# Patient Record
Sex: Female | Born: 1990 | Race: White | Hispanic: No | Marital: Single
Health system: Southern US, Community
[De-identification: ages and names within clinical notes are randomized; demographics above are authoritative.]

## PROBLEM LIST (undated history)

## (undated) DIAGNOSIS — E611 Iron deficiency: Secondary | ICD-10-CM

## (undated) DIAGNOSIS — Z8669 Personal history of other diseases of the nervous system and sense organs: Secondary | ICD-10-CM

## (undated) DIAGNOSIS — O2690 Pregnancy related conditions, unspecified, unspecified trimester: Secondary | ICD-10-CM

## (undated) HISTORY — DX: Personal history of other diseases of the nervous system and sense organs: Z86.69

## (undated) HISTORY — DX: Iron deficiency: E61.1

## (undated) HISTORY — DX: Pregnancy related conditions, unspecified, unspecified trimester: O26.90

---
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
## 2008-03-14 ENCOUNTER — Emergency Department (HOSPITAL_COMMUNITY): Admission: EM | Admit: 2008-03-14 | Discharge: 2008-03-14 | Payer: Self-pay | Admitting: Family Medicine

## 2008-03-17 ENCOUNTER — Ambulatory Visit: Payer: Self-pay | Admitting: Vascular Surgery

## 2008-03-17 ENCOUNTER — Encounter (INDEPENDENT_AMBULATORY_CARE_PROVIDER_SITE_OTHER): Payer: Self-pay | Admitting: Emergency Medicine

## 2008-03-17 ENCOUNTER — Emergency Department (HOSPITAL_COMMUNITY): Admission: EM | Admit: 2008-03-17 | Discharge: 2008-03-17 | Payer: Self-pay | Admitting: Family Medicine

## 2008-12-06 IMAGING — CT CT ANGIO CHEST
1 of 4 series · 15 of 31 positions shown · IV contrast (agent unspecified)
Comparison: Plain film of earlier today.

CLINICAL DATA: RIGHT-SIDED CHEST PAIN.  WEAKNESS.  DIZZINESS.

CT Angiography of the Chest.
TECHNIQUE: Multidetector CT angiography of the chest was performed
after contrast with bolus timed to evaluate the pulmonary arteries.
Contrast:  80 ml [id number]

[Series 2: pe · axial · 0.59mm/px · z∈[-264,-15]mm · 15 of 231 slices shown]
[im 16/231  lung]
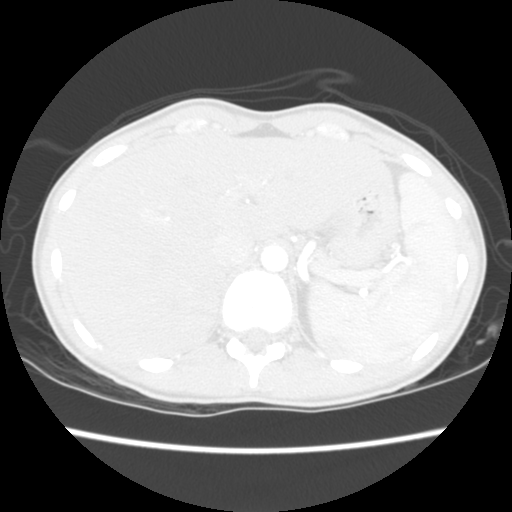
[im 31/231  mediastinal]
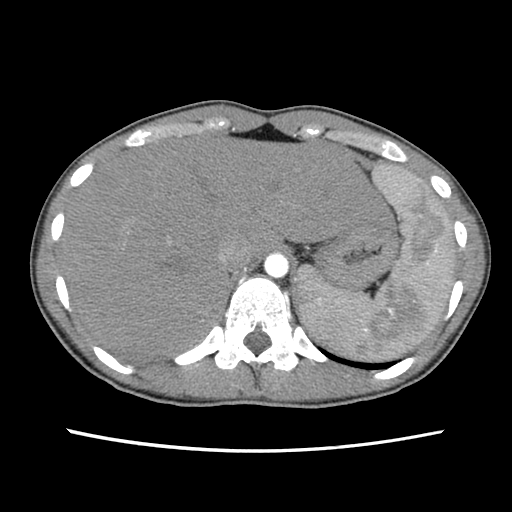
[im 47/231  lung]
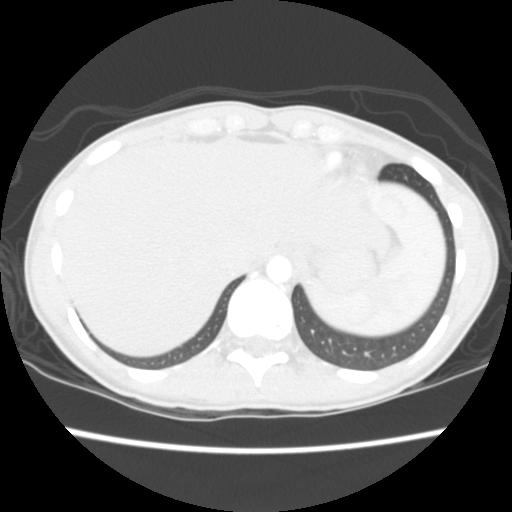
[im 62/231  mediastinal]
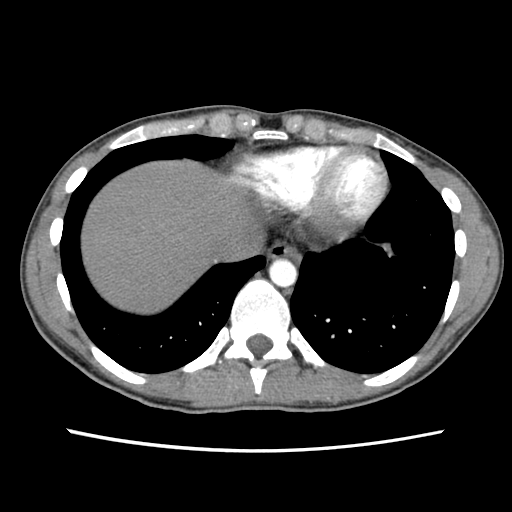
[im 77/231  lung]
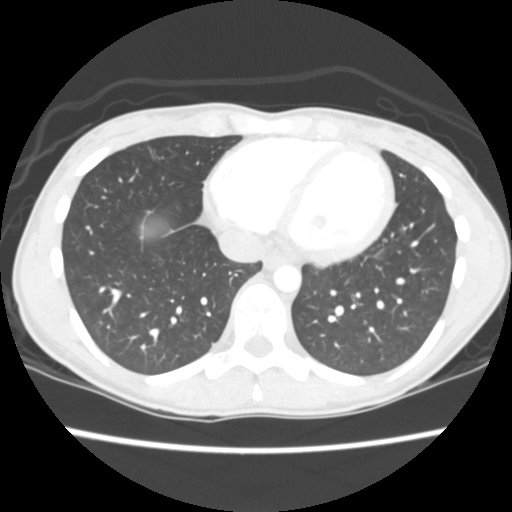
[im 93/231  mediastinal]
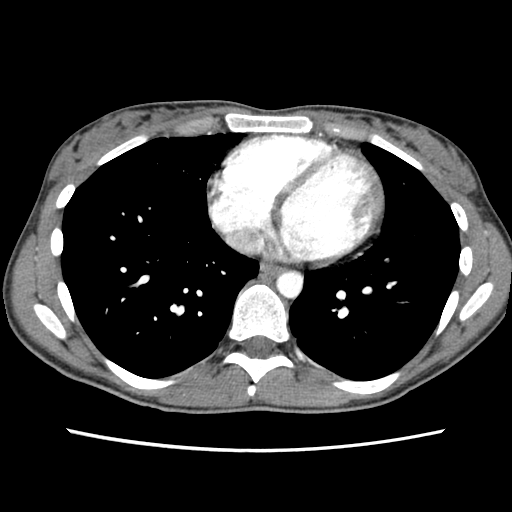
[im 108/231  lung]
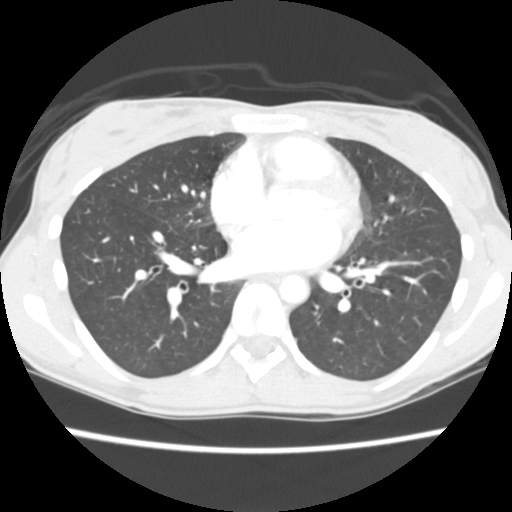
[im 116/231  mediastinal]
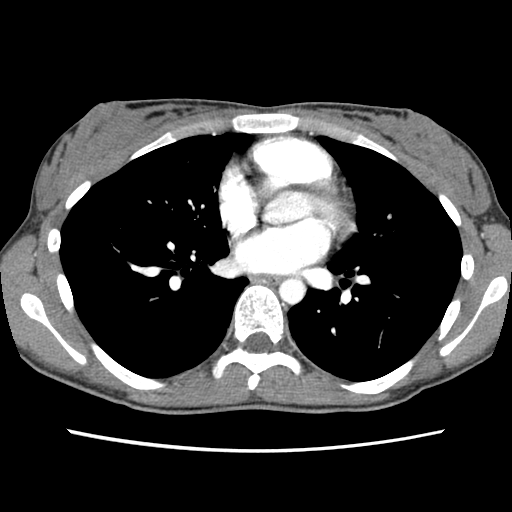
[im 123/231  lung]
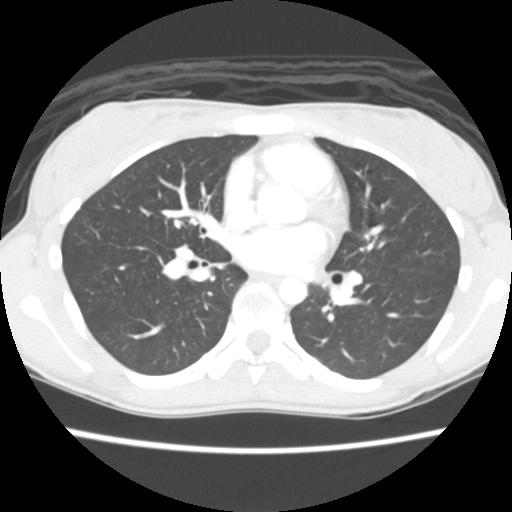
[im 139/231  mediastinal]
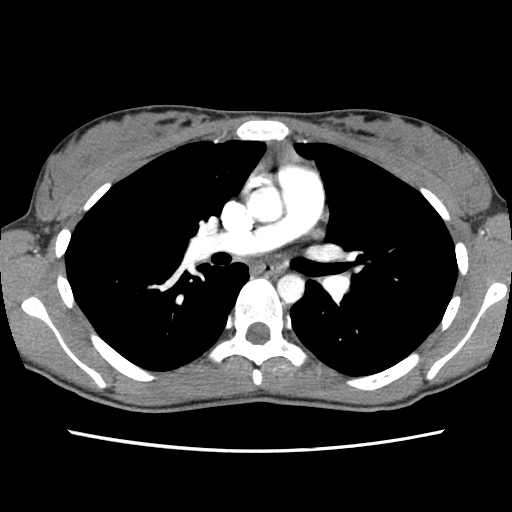
[im 154/231  lung]
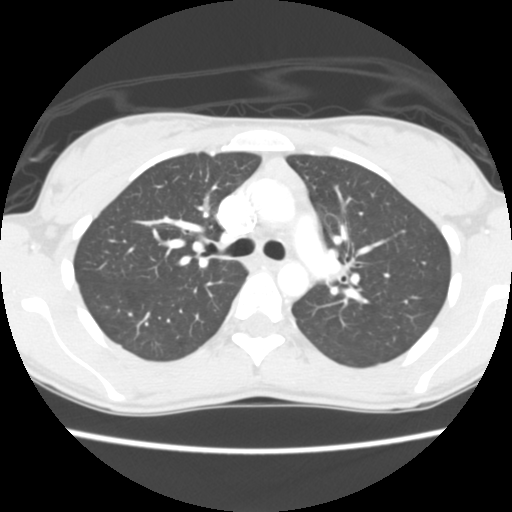
[im 169/231  mediastinal]
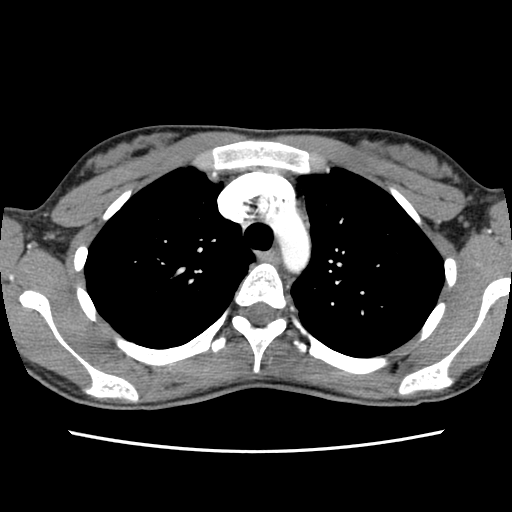
[im 185/231  lung]
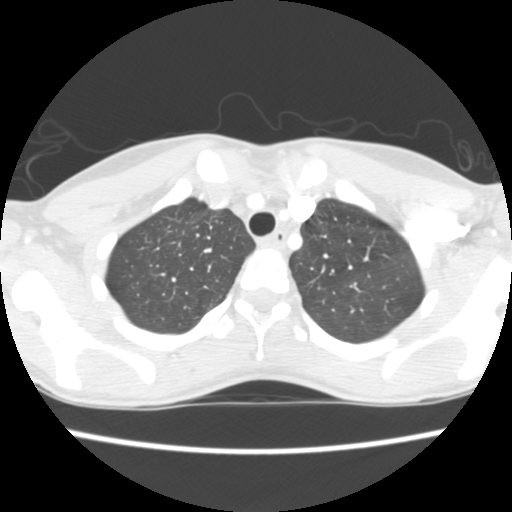
[im 200/231  mediastinal]
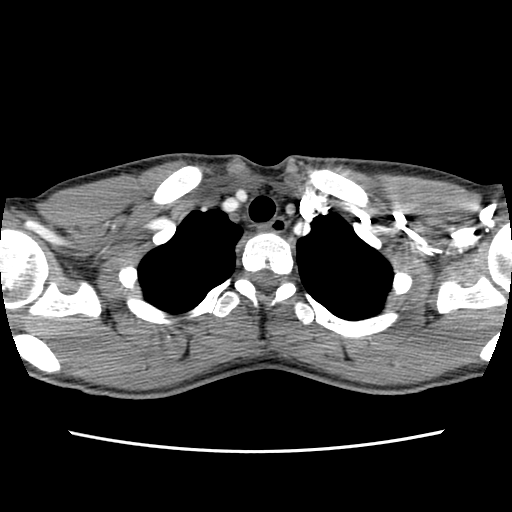
[im 215/231  lung]
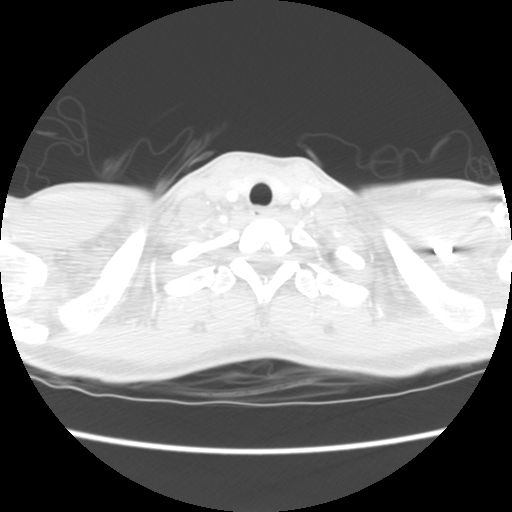

[15 of 31 positions shown; findings below may reference images not displayed]

FINDINGS: Lung windows demonstrate mild motion artifact involving
the lung bases.  Minimal increased density in the posterior right
apex on image 28 of series 4 which likely relates to atelectasis.

Soft tissue windows:  The quality of this exam for evaluation of
pulmonary embolism is good to excellent. no evidence of pulmonary
embolism.

 Normal aortic caliber without dissection. Normal heart size
without pericardial or pleural effusion.  No mediastinal or hilar
adenopathy. Anterior mediastinal residual thymus No acute osseous
abnormality. Limited abdominal imaging is unremarkable.
IMPRESSION: 1.  No evidence of pulmonary embolism.
2.  No acute process in the chest.

## 2009-05-16 ENCOUNTER — Emergency Department (HOSPITAL_COMMUNITY): Admission: EM | Admit: 2009-05-16 | Discharge: 2009-05-16 | Payer: Self-pay | Admitting: Family Medicine

## 2011-01-05 LAB — POCT URINALYSIS DIP (DEVICE)
Bilirubin Urine: NEGATIVE
Glucose, UA: NEGATIVE mg/dL
Ketones, ur: NEGATIVE mg/dL
Protein, ur: NEGATIVE mg/dL
pH: 7.5 (ref 5.0–8.0)

## 2011-06-27 LAB — POCT URINALYSIS DIP (DEVICE)
Bilirubin Urine: NEGATIVE
Glucose, UA: NEGATIVE
Hgb urine dipstick: NEGATIVE
Ketones, ur: NEGATIVE
Nitrite: NEGATIVE
Protein, ur: NEGATIVE
Specific Gravity, Urine: 1.02
Urobilinogen, UA: 1

## 2011-06-27 LAB — POCT I-STAT, CHEM 8
Calcium, Ion: 1.19
Chloride: 103
Creatinine, Ser: 0.7
Glucose, Bld: 81
HCT: 40
Hemoglobin: 13.6

## 2011-06-27 LAB — HEPATIC FUNCTION PANEL
ALT: 11
AST: 15
Albumin: 3.7
Alkaline Phosphatase: 54
Bilirubin, Direct: 0.2
Indirect Bilirubin: 0.7
Total Bilirubin: 0.9
Total Protein: 6.8

## 2011-06-27 LAB — LIPASE, BLOOD: Lipase: 18

## 2011-06-27 LAB — DIFFERENTIAL
Eosinophils Absolute: 0.2
Eosinophils Relative: 2
Monocytes Relative: 9
Neutro Abs: 5.4

## 2011-06-27 LAB — POCT PREGNANCY, URINE
Operator id: 272551
Preg Test, Ur: NEGATIVE

## 2011-06-27 LAB — CBC
MCV: 89.1
WBC: 8

## 2020-07-29 ENCOUNTER — Encounter (HOSPITAL_COMMUNITY): Payer: Self-pay | Admitting: Emergency Medicine

## 2020-07-29 ENCOUNTER — Emergency Department (HOSPITAL_COMMUNITY)
Admission: EM | Admit: 2020-07-29 | Discharge: 2020-07-29 | Disposition: A | Discharge status: Home / Self Care | Attending: Emergency Medicine | Admitting: Emergency Medicine

## 2020-07-29 ENCOUNTER — Emergency Department (HOSPITAL_COMMUNITY)

## 2020-07-29 ENCOUNTER — Other Ambulatory Visit: Payer: Self-pay

## 2020-07-29 DIAGNOSIS — T7840XA Allergy, unspecified, initial encounter: Secondary | ICD-10-CM | POA: Diagnosis not present

## 2020-07-29 DIAGNOSIS — R21 Rash and other nonspecific skin eruption: Secondary | ICD-10-CM | POA: Diagnosis present

## 2020-07-29 LAB — I-STAT BETA HCG BLOOD, ED (MC, WL, AP ONLY): I-stat hCG, quantitative: <5 m[IU]/mL (ref <5)

## 2020-07-29 MED ORDER — PREDNISONE 20 MG PO TABS: ORAL_TABLET | ORAL | 0 refills | Status: DC

## 2020-07-29 MED ORDER — SODIUM CHLORIDE 0.9 % IV BOLUS
500.0000 mL | Freq: Once | INTRAVENOUS | Status: AC
  Administered 2020-07-29: 500 mL via INTRAVENOUS

## 2020-07-29 MED ORDER — DIPHENHYDRAMINE HCL 50 MG/ML IJ SOLN: 50.0000 mg | Freq: Once | INTRAMUSCULAR | Status: AC

## 2020-07-29 MED ORDER — FAMOTIDINE 20 MG PO TABS: 20.0000 mg | ORAL_TABLET | Freq: Two times a day (BID) | ORAL | 0 refills | Status: DC

## 2020-07-29 MED ORDER — METHYLPREDNISOLONE SODIUM SUCC 125 MG IJ SOLR
INTRAMUSCULAR | Status: AC
  Administered 2020-07-29: 125 mg via INTRAVENOUS
  Filled 2020-07-29: qty 2

## 2020-07-29 MED ORDER — FAMOTIDINE IN NACL 20-0.9 MG/50ML-% IV SOLN
20.0000 mg | Freq: Once | INTRAVENOUS | Status: AC
  Administered 2020-07-29: 20 mg via INTRAVENOUS
  Filled 2020-07-29: qty 50

## 2020-07-29 MED ORDER — METHYLPREDNISOLONE SODIUM SUCC 125 MG IJ SOLR: 125.0000 mg | Freq: Once | INTRAMUSCULAR | Status: AC

## 2020-07-29 MED ORDER — DIPHENHYDRAMINE HCL 50 MG/ML IJ SOLN
INTRAMUSCULAR | Status: AC
  Administered 2020-07-29: 50 mg via INTRAVENOUS
  Filled 2020-07-29: qty 1

## 2020-07-29 NOTE — ED Triage Notes (Addendum)
Pt from home; says began having an allergic reaction at 1200; says took several doses of Benadryl with improvement, but worsening again after Benadryl wore off.  Pt with global splotchy red, itchy rash.

## 2020-07-29 NOTE — ED Provider Notes (Signed)
[facility] EMERGENCY DEPARTMENT Provider Note   CSN: [id number] Arrival date & time: 07/29/20  0403     History No chief complaint on file.   Katrina Kim is a 29 y.o. female.  The history is provided by the patient.  Allergic Reaction Presenting symptoms: itching and rash   Presenting symptoms: no swelling   Severity:  Moderate Duration:  1 day Prior allergic episodes:  No prior episodes Context: food and medications   Relieved by:  Nothing Worsened by:  Nothing Ineffective treatments:  None tried      History reviewed. No pertinent past medical history.  There are no problems to display for this patient.   History reviewed. No pertinent surgical history.   OB History   No obstetric history on file.     History reviewed. No pertinent family history.  Social History   Tobacco Use  . Smoking status: Not on file  Substance Use Topics  . Alcohol use: Not on file  . Drug use: Not on file    Home Medications Prior to Admission medications   Not on File    Allergies    Patient has no allergy information on record.  Review of Systems   Review of Systems  Constitutional: Negative for fever.  HENT: Negative for congestion.   Eyes: Negative for visual disturbance.  Respiratory: Negative for shortness of breath.   Cardiovascular: Negative for chest pain.  Gastrointestinal: Negative for abdominal pain.  Genitourinary: Negative for difficulty urinating.  Musculoskeletal: Negative for arthralgias.  Skin: Positive for itching and rash.  Neurological: Negative for dizziness.  Psychiatric/Behavioral: The patient is nervous/anxious.   All other systems reviewed and are negative.   Physical Exam Updated Vital Signs BP 109/71   Pulse (!) 59   Resp 20   SpO2 97%   Physical Exam Vitals and nursing note reviewed.  Constitutional:      General: She is not in acute distress.    Appearance: Normal appearance.  HENT:     Head:  Normocephalic and atraumatic.     Nose: Nose normal.     Mouth/Throat:     Mouth: Mucous membranes are dry.     Pharynx: Oropharynx is clear.     Comments: No swelling of lips, tongue, uvula  Eyes:     Conjunctiva/sclera: Conjunctivae normal.     Pupils: Pupils are equal, round, and reactive to light.  Cardiovascular:     Rate and Rhythm: Normal rate and regular rhythm.     Pulses: Normal pulses.     Heart sounds: Normal heart sounds.  Pulmonary:     Effort: Pulmonary effort is normal.     Breath sounds: Normal breath sounds. No stridor. No wheezing.  Abdominal:     General: Abdomen is flat. Bowel sounds are normal.     Palpations: Abdomen is soft.     Tenderness: There is no abdominal tenderness. There is no guarding.  Musculoskeletal:        General: Normal range of motion.     Cervical back: Normal range of motion and neck supple.  Skin:    General: Skin is warm and dry.     Capillary Refill: Capillary refill takes less than 2 seconds.     Findings: Rash present.  Neurological:     General: No focal deficit present.     Mental Status: She is alert and oriented to person, place, and time.     Deep Tendon Reflexes: Reflexes normal.  Psychiatric:        Mood and Affect: Mood normal.        Behavior: Behavior normal.     ED Results / Procedures / Treatments   Labs (all labs ordered are listed, but only abnormal results are displayed) Labs Reviewed  I-STAT BETA HCG BLOOD, ED ([facility], [facility], [facility] ONLY)    EKG None  Radiology DG Chest Portable 1 View  Result Date: 07/29/2020 CLINICAL DATA:  Allergic reaction. EXAM: PORTABLE CHEST 1 VIEW COMPARISON:  03/17/2008 FINDINGS: The heart size and mediastinal contours are within normal limits. Both lungs are clear. No pleural effusion or pneumothorax. The visualized skeletal structures are unremarkable. IMPRESSION: No active disease. Electronically Signed   By: Amie Portland M.D.   On: 07/29/2020 05:21    Procedures Procedures  (including critical care time)  Medications Ordered in ED Medications  methylPREDNISolone sodium succinate (SOLU-MEDROL) 125 mg/2 mL injection 125 mg (125 mg Intravenous Given 07/29/20 0424)  famotidine (PEPCID) IVPB 20 mg premix (0 mg Intravenous Stopped 07/29/20 0452)  diphenhydrAMINE (BENADRYL) injection 50 mg (50 mg Intravenous Given 07/29/20 0423)  sodium chloride 0.9 % bolus 500 mL (0 mLs Intravenous Stopped 07/29/20 0527)    ED Course  I have reviewed the triage vital signs and the nursing notes.  Pertinent labs & imaging results that were available during my care of the patient were reviewed by me and considered in my medical decision making (see chart for details).    No signs of anaphylaxis on exam.  Symptoms markedly improved post medication. I suspect a lot od this is anxiety.  Will start steroids and pepcid and continue to take benadrly every 6 hours for 2 days.    Katrina Kim was evaluated in Emergency Department on 07/29/2020 for the symptoms described in the history of present illness. She was evaluated in the context of the global COVID-19 pandemic, which necessitated consideration that the patient might be at risk for infection with the SARS-CoV-2 virus that causes COVID-19. Institutional protocols and algorithms that pertain to the evaluation of patients at risk for COVID-19 are in a state of rapid change based on information released by regulatory bodies including the CDC and federal and state organizations. These policies and algorithms were followed during the patient's care in the ED.  Final Clinical Impression(s) / ED Diagnoses Return for intractable cough, coughing up blood,fevers >100.4 unrelieved by medication, shortness of breath, intractable vomiting, chest pain, shortness of breath, weakness,numbness, changes in speech, facial asymmetry,abdominal pain, passing out,Inability to tolerate liquids or food, cough, altered mental status or any concerns. No  signs of systemic illness or infection. The patient is nontoxic-appearing on exam and vital signs are within normal limits.   I have reviewed the triage vital signs and the nursing notes. Pertinent labs &imaging results that were available during my care of the patient were reviewed by me and considered in my medical decision making (see chart for details).After history, exam, and medical workup I feel the patient has beenappropriately medically screened and is safe for discharge home. Pertinent diagnoses were discussed with the patient. Patient was given return precautions.    Ola Fawver, MD 07/29/20 [phone]

## 2023-06-17 NOTE — Progress Notes (Deleted)
   NEUROLOGY CONSULTATION NOTE  Katrina Kim MRN: [id number] DOB: ***  Referring provider: *** Primary care provider: ***  Reason for consult:  ***  Assessment/Plan:   ***   Subjective:  ***  ***.  She had an MRI of the brain with and without contrast on 9/12 which revealed "scattered foci of signal abnormality within the frontal centrum semiovale, nonspecific, which may be seen in the setting of demyelination, migraine, chronic microvascular disease, and other etiologies" but no abnormality of the orbits or optic nerves.     PAST MEDICAL HISTORY: No past medical history on file.  PAST SURGICAL HISTORY: No past surgical history on file.  MEDICATIONS: Current Outpatient Medications on File Prior to Visit  Medication Sig Dispense Refill   famotidine (PEPCID) 20 MG tablet Take 1 tablet (20 mg total) by mouth 2 (two) times daily. 14 tablet 0   predniSONE (DELTASONE) 20 MG tablet 3 tabs po day one, then 2 po daily x 4 days 11 tablet 0   No current facility-administered medications on file prior to visit.    ALLERGIES: No Known Allergies  FAMILY HISTORY: No family history on file.  Objective:  *** General: No acute distress.  Patient appears well-groomed.   Head:  Normocephalic/atraumatic Eyes:  fundi examined but not visualized Neck: supple, no paraspinal tenderness, full range of motion Back: No paraspinal tenderness Heart: regular rate and rhythm Lungs: Clear to auscultation bilaterally. Vascular: No carotid bruits. Neurological Exam: Mental status: alert and oriented to person, place, and time, speech fluent and not dysarthric, language intact. Cranial nerves: CN I: not tested CN II: pupils equal, round and reactive to light, visual fields intact CN III, IV, VI:  full range of motion, no nystagmus, no ptosis CN V: facial sensation intact. CN VII: upper and lower face symmetric CN VIII: hearing intact CN IX, X: gag intact, uvula midline CN XI:  sternocleidomastoid and trapezius muscles intact CN XII: tongue midline Bulk & Tone: normal, no fasciculations. Motor:  muscle strength 5/5 throughout Sensation:  Pinprick, temperature and vibratory sensation intact. Deep Tendon Reflexes:  2+ throughout,  toes downgoing.   Finger to nose testing:  Without dysmetria.   Heel to shin:  Without dysmetria.   Gait:  Normal station and stride.  Romberg negative.    Thank you for allowing me to take part in the care of this patient.  Shon Millet, DO  CC: ***

## 2023-06-18 ENCOUNTER — Ambulatory Visit: Admitting: Neurology

## 2023-07-07 NOTE — Progress Notes (Unsigned)
NEUROLOGY CONSULTATION NOTE  Katrina Kim MRN: [id number] DOB: [birth year]  Referring provider: Audrea Muscat, NP Primary care provider: Audrea Muscat, NP  Reason for consult:  evaluate for possible multiple sclerosis  Assessment/Plan:   White matter abnormalities on brain MRI - nonspecific but suspect related to history of migraine.  I am not concerned about MS. Blurred vision/headache - consider idiopathic intracranial hypertension   Will refer to ophthalmology for more thorough evaluation to rule out papilledema Regardless of eye exam findings, plan will be to order LP to assess opening pressure (would also check CSF cell count, protein, glucose, gram stain/culture and cytology) Further recommendations pending results. Follow up after testing.   Subjective:  Katrina Kim is a 32 year old right-handed female with history of hyperthyroidism who presents for evaluation of possible multiple sclerosis.  History supplemented by her accompanying husband and referring provider's note.  MRI of brain personally reviewed.  During her third trimester in February 2024, she started experiencing blurred vision.  When she would focus on something, such as her phone, her vision would become blurred.  Present even when closing either eye.  It would take 5 minutes for her vision to adjust.  Afterwards, she may feel nauseous for up to 30 minutes.  This would occur 2-3 times an hour.  She also started experiencing a dull 1-2/10 pressure headache on the top bilateral parietal regions.  No associated nausea, vomiting, photophobia, phonophobia or visual disturbance.  Lasts just a couple of minutes but tends to occur early in the morning when she wakes up at 5 to 6 AM and at night around 9 PM.  Headache not positional and not aggravated by valsalva.  Has chronic ringing in the ears.  Sometimes hears here heart beat but not often.  She stopped using her contact lenses and started using her glasses but no  change.  She reports between a 40 and 50 lb weight gain during her pregnancy.  She had her baby on December 30, 2022.  She has not noticed any improvement in symptoms since then.  She has lost more than 20 lbs since she had her baby.  She had an eye exam with optometry soon after giving birth and was told her vision was fine.  She has history of hyperthyroidism, no longer needed to be treated.  Thyroid labs were unremarkable.  She had an MRI of the brain with and without contrast on 9/12 which revealed scattered nonspecific punctate T2 FLAIR hyperintense foci within the bilateral cerebral white matter but no abnormalities of the orbits or optic nerves.    She has remote history of migraines described as severe pounding headache with nausea, vomiting, photophobia and phonophobia.  She hasn't had a migraine in several years.  She is not breastfeeding.    06/12/2023 MRI BRAIN W WO:  "scattered foci of signal abnormality within the frontal centrum semiovale, nonspecific, which may be seen in the setting of demyelination, migraine, chronic microvascular disease, and other etiologies" PAST MEDICAL HISTORY: Past Medical History:  Diagnosis Date   Hx of migraines    Iron deficiency    Pregnancy with complication     PAST SURGICAL HISTORY: No past surgical history on file.  MEDICATIONS: Current Outpatient Medications on File Prior to Visit  Medication Sig Dispense Refill   EPINEPHrine 0.3 mg/0.3 mL IJ SOAJ injection Inject 0.3 mg into the muscle as needed.     escitalopram (LEXAPRO) 20 MG tablet Take 20 mg by mouth daily.  FEROSUL 325 (65 Fe) MG tablet Take 325 mg by mouth daily.     No current facility-administered medications on file prior to visit.     ALLERGIES: No Known Allergies  FAMILY HISTORY: Family History  Problem Relation Age of Onset   Stroke Maternal Grandfather    Dementia Paternal Grandmother    Stroke Paternal Grandfather     Objective:  Blood pressure 110/73, pulse  90, height 5\' 7"  (1.702 m), weight 183 lb (83 kg), SpO2 98%. General: No acute distress.  Patient appears well-groomed.   Head:  Normocephalic/atraumatic Eyes:  fundi examined but not visualized Neck: supple, no paraspinal tenderness, full range of motion Heart: regular rate and rhythm Neurological Exam: Mental status: alert and oriented to person, place, and time, speech fluent and not dysarthric, language intact. Cranial nerves: CN I: not tested CN II: pupils equal, round and reactive to light, visual fields intact CN III, IV, VI:  full range of motion, no nystagmus, no ptosis CN V: facial sensation intact. CN VII: upper and lower face symmetric CN VIII: hearing intact CN IX, X: gag intact, uvula midline CN XI: sternocleidomastoid and trapezius muscles intact CN XII: tongue midline Bulk & Tone: normal, no fasciculations. Motor:  muscle strength 5/5 throughout Sensation:  Pinprick and vibratory sensation intact. Deep Tendon Reflexes:  2+ throughout,  toes downgoing.   Finger to nose testing:  Without dysmetria.   Gait:  Normal station and stride.  Romberg negative.    Thank you for allowing me to take part in the care of this patient.  Shon Millet, DO  CC: Audrea Muscat, NP

## 2023-07-08 ENCOUNTER — Ambulatory Visit: Admitting: Neurology

## 2023-07-08 ENCOUNTER — Encounter: Payer: Self-pay | Admitting: Neurology

## 2023-07-08 VITALS — BP 110/73 | HR 90 | Ht 67.0 in | Wt 183.0 lb

## 2023-07-08 DIAGNOSIS — R519 Headache, unspecified: Secondary | ICD-10-CM

## 2023-07-08 DIAGNOSIS — H538 Other visual disturbances: Secondary | ICD-10-CM | POA: Diagnosis not present

## 2023-07-08 NOTE — Patient Instructions (Addendum)
I would like to evaluate for IDIOPATHIC INTRACRANIAL HYPERTENSION   Refer to ophthalmology for evaluation of papilledema Regardless of eye exam findings, will still likely order a spinal tap Further recommendations pending test results.
